# Patient Record
Sex: Female | Born: 1960 | Race: White | Hispanic: No | Marital: Married | Smoking: Never smoker
Health system: Southern US, Community
[De-identification: ages and names within clinical notes are randomized; demographics above are authoritative.]

## PROBLEM LIST (undated history)

## (undated) DIAGNOSIS — E785 Hyperlipidemia, unspecified: Secondary | ICD-10-CM

## (undated) DIAGNOSIS — E039 Hypothyroidism, unspecified: Secondary | ICD-10-CM

## (undated) HISTORY — PX: TUBAL LIGATION: SHX77

## (undated) HISTORY — PX: WISDOM TOOTH EXTRACTION: SHX21

## (undated) HISTORY — PX: COLONOSCOPY: SHX174

---

## 1997-08-25 ENCOUNTER — Inpatient Hospital Stay (HOSPITAL_COMMUNITY): Admission: AD | Admit: 1997-08-25 | Discharge: 1997-09-05 | Payer: Self-pay | Admitting: Obstetrics and Gynecology

## 1997-09-04 ENCOUNTER — Encounter (HOSPITAL_COMMUNITY): Admission: RE | Admit: 1997-09-04 | Discharge: 1997-12-03 | Payer: Self-pay | Admitting: Obstetrics and Gynecology

## 1998-05-28 ENCOUNTER — Ambulatory Visit (HOSPITAL_COMMUNITY): Admission: RE | Admit: 1998-05-28 | Discharge: 1998-05-28 | Payer: Self-pay | Admitting: Obstetrics and Gynecology

## 1998-08-11 ENCOUNTER — Inpatient Hospital Stay (HOSPITAL_COMMUNITY): Admission: AD | Admit: 1998-08-11 | Discharge: 1998-08-15 | Payer: Self-pay | Admitting: Obstetrics and Gynecology

## 1998-08-14 ENCOUNTER — Encounter: Payer: Self-pay | Admitting: Obstetrics and Gynecology

## 2001-05-31 ENCOUNTER — Other Ambulatory Visit: Admission: RE | Admit: 2001-05-31 | Discharge: 2001-05-31 | Payer: Self-pay | Admitting: Obstetrics and Gynecology

## 2004-09-30 ENCOUNTER — Other Ambulatory Visit: Admission: RE | Admit: 2004-09-30 | Discharge: 2004-09-30 | Payer: Self-pay | Admitting: *Deleted

## 2005-12-17 ENCOUNTER — Encounter: Admission: RE | Admit: 2005-12-17 | Discharge: 2005-12-17 | Payer: Self-pay | Admitting: Family Medicine

## 2005-12-18 ENCOUNTER — Other Ambulatory Visit: Admission: RE | Admit: 2005-12-18 | Discharge: 2005-12-18 | Payer: Self-pay | Admitting: *Deleted

## 2007-04-27 ENCOUNTER — Other Ambulatory Visit: Admission: RE | Admit: 2007-04-27 | Discharge: 2007-04-27 | Payer: Self-pay | Admitting: *Deleted

## 2007-05-25 ENCOUNTER — Encounter: Admission: RE | Admit: 2007-05-25 | Discharge: 2007-05-25 | Payer: Self-pay | Admitting: Family Medicine

## 2008-05-16 ENCOUNTER — Other Ambulatory Visit: Admission: RE | Admit: 2008-05-16 | Discharge: 2008-05-16 | Payer: Self-pay | Admitting: Family Medicine

## 2008-06-14 ENCOUNTER — Encounter: Admission: RE | Admit: 2008-06-14 | Discharge: 2008-06-14 | Payer: Self-pay | Admitting: Family Medicine

## 2008-06-20 ENCOUNTER — Ambulatory Visit: Payer: Self-pay | Admitting: Radiology

## 2008-06-20 ENCOUNTER — Emergency Department (HOSPITAL_BASED_OUTPATIENT_CLINIC_OR_DEPARTMENT_OTHER): Admission: EM | Admit: 2008-06-20 | Discharge: 2008-06-20 | Payer: Self-pay | Admitting: Emergency Medicine

## 2008-06-21 ENCOUNTER — Encounter: Admission: RE | Admit: 2008-06-21 | Discharge: 2008-06-21 | Payer: Self-pay | Admitting: Family Medicine

## 2011-03-11 ENCOUNTER — Other Ambulatory Visit: Payer: Self-pay | Admitting: Family Medicine

## 2011-03-11 ENCOUNTER — Other Ambulatory Visit (HOSPITAL_COMMUNITY)
Admission: RE | Admit: 2011-03-11 | Discharge: 2011-03-11 | Disposition: A | Discharge status: Home / Self Care | Payer: 59 | Source: Ambulatory Visit | Attending: Family Medicine | Admitting: Family Medicine

## 2011-03-11 DIAGNOSIS — Z1159 Encounter for screening for other viral diseases: Secondary | ICD-10-CM | POA: Insufficient documentation

## 2011-03-11 DIAGNOSIS — Z124 Encounter for screening for malignant neoplasm of cervix: Secondary | ICD-10-CM | POA: Insufficient documentation

## 2011-05-05 ENCOUNTER — Other Ambulatory Visit: Payer: Self-pay | Admitting: Family Medicine

## 2011-05-05 ENCOUNTER — Other Ambulatory Visit: Payer: Self-pay | Admitting: Internal Medicine

## 2011-05-05 DIAGNOSIS — Z1231 Encounter for screening mammogram for malignant neoplasm of breast: Secondary | ICD-10-CM

## 2011-06-04 ENCOUNTER — Ambulatory Visit
Admission: RE | Admit: 2011-06-04 | Discharge: 2011-06-04 | Disposition: A | Discharge status: Home / Self Care | Payer: 59 | Source: Ambulatory Visit | Attending: Family Medicine | Admitting: Family Medicine

## 2011-06-04 DIAGNOSIS — Z1231 Encounter for screening mammogram for malignant neoplasm of breast: Secondary | ICD-10-CM

## 2013-11-28 ENCOUNTER — Other Ambulatory Visit: Payer: Self-pay | Admitting: Family Medicine

## 2013-11-28 DIAGNOSIS — R748 Abnormal levels of other serum enzymes: Secondary | ICD-10-CM

## 2013-12-05 ENCOUNTER — Ambulatory Visit
Admission: RE | Admit: 2013-12-05 | Discharge: 2013-12-05 | Disposition: A | Discharge status: Home / Self Care | Payer: 59 | Source: Ambulatory Visit | Attending: Family Medicine | Admitting: Family Medicine

## 2013-12-05 DIAGNOSIS — R748 Abnormal levels of other serum enzymes: Secondary | ICD-10-CM

## 2013-12-27 ENCOUNTER — Other Ambulatory Visit: Payer: Self-pay | Admitting: Gastroenterology

## 2015-03-08 ENCOUNTER — Other Ambulatory Visit: Payer: Self-pay | Admitting: Physician Assistant

## 2015-03-08 ENCOUNTER — Other Ambulatory Visit: Payer: Self-pay

## 2015-03-08 ENCOUNTER — Other Ambulatory Visit (HOSPITAL_COMMUNITY)
Admission: RE | Admit: 2015-03-08 | Discharge: 2015-03-08 | Disposition: A | Discharge status: Home / Self Care | Payer: 59 | Source: Ambulatory Visit | Attending: Physician Assistant | Admitting: Physician Assistant

## 2015-03-08 DIAGNOSIS — Z01419 Encounter for gynecological examination (general) (routine) without abnormal findings: Secondary | ICD-10-CM | POA: Diagnosis present

## 2015-03-08 DIAGNOSIS — Z1151 Encounter for screening for human papillomavirus (HPV): Secondary | ICD-10-CM | POA: Diagnosis present

## 2015-03-08 DIAGNOSIS — Z1231 Encounter for screening mammogram for malignant neoplasm of breast: Secondary | ICD-10-CM

## 2015-03-11 ENCOUNTER — Other Ambulatory Visit: Payer: Self-pay | Admitting: Physician Assistant

## 2015-03-11 DIAGNOSIS — R221 Localized swelling, mass and lump, neck: Secondary | ICD-10-CM

## 2015-03-13 LAB — CYTOLOGY - PAP

## 2015-03-14 ENCOUNTER — Ambulatory Visit: Payer: Self-pay

## 2015-03-19 ENCOUNTER — Ambulatory Visit
Admission: RE | Admit: 2015-03-19 | Discharge: 2015-03-19 | Disposition: A | Discharge status: Home / Self Care | Payer: 59 | Source: Ambulatory Visit | Attending: Physician Assistant | Admitting: Physician Assistant

## 2015-03-19 ENCOUNTER — Ambulatory Visit
Admission: RE | Admit: 2015-03-19 | Discharge: 2015-03-19 | Disposition: A | Discharge status: Home / Self Care | Payer: 59 | Source: Ambulatory Visit

## 2015-03-19 DIAGNOSIS — Z1231 Encounter for screening mammogram for malignant neoplasm of breast: Secondary | ICD-10-CM

## 2015-03-19 DIAGNOSIS — R221 Localized swelling, mass and lump, neck: Secondary | ICD-10-CM

## 2016-03-13 ENCOUNTER — Other Ambulatory Visit (HOSPITAL_COMMUNITY)
Admission: RE | Admit: 2016-03-13 | Discharge: 2016-03-13 | Disposition: A | Discharge status: Home / Self Care | Payer: 59 | Source: Ambulatory Visit | Attending: Physician Assistant | Admitting: Physician Assistant

## 2016-03-13 ENCOUNTER — Other Ambulatory Visit: Payer: Self-pay | Admitting: Physician Assistant

## 2016-03-13 DIAGNOSIS — Z124 Encounter for screening for malignant neoplasm of cervix: Secondary | ICD-10-CM | POA: Diagnosis present

## 2016-03-13 DIAGNOSIS — Z1151 Encounter for screening for human papillomavirus (HPV): Secondary | ICD-10-CM | POA: Diagnosis not present

## 2016-03-16 LAB — CYTOLOGY - PAP

## 2016-04-20 ENCOUNTER — Encounter (HOSPITAL_COMMUNITY): Payer: Self-pay | Admitting: *Deleted

## 2016-04-29 ENCOUNTER — Ambulatory Visit (HOSPITAL_COMMUNITY)
Admission: RE | Admit: 2016-04-29 | Discharge: 2016-04-29 | Disposition: A | Discharge status: Home / Self Care | Payer: 59 | Source: Ambulatory Visit | Attending: Obstetrics and Gynecology | Admitting: Obstetrics and Gynecology

## 2016-04-29 ENCOUNTER — Encounter (HOSPITAL_COMMUNITY): Payer: Self-pay

## 2016-04-29 ENCOUNTER — Ambulatory Visit (HOSPITAL_COMMUNITY): Payer: 59 | Admitting: Anesthesiology

## 2016-04-29 ENCOUNTER — Encounter (HOSPITAL_COMMUNITY)
Admission: RE | Disposition: A | Discharge status: Home / Self Care | Payer: Self-pay | Source: Ambulatory Visit | Attending: Obstetrics and Gynecology

## 2016-04-29 ENCOUNTER — Other Ambulatory Visit: Payer: Self-pay | Admitting: Obstetrics and Gynecology

## 2016-04-29 DIAGNOSIS — N920 Excessive and frequent menstruation with regular cycle: Secondary | ICD-10-CM | POA: Insufficient documentation

## 2016-04-29 DIAGNOSIS — N840 Polyp of corpus uteri: Secondary | ICD-10-CM | POA: Diagnosis present

## 2016-04-29 DIAGNOSIS — N939 Abnormal uterine and vaginal bleeding, unspecified: Secondary | ICD-10-CM | POA: Diagnosis present

## 2016-04-29 HISTORY — DX: Hypothyroidism, unspecified: E03.9

## 2016-04-29 HISTORY — DX: Hyperlipidemia, unspecified: E78.5

## 2016-04-29 HISTORY — PX: DILATATION & CURETTAGE/HYSTEROSCOPY WITH MYOSURE: SHX6511

## 2016-04-29 LAB — CBC
HCT: 36.8 % (ref 36.0–46.0)
Hemoglobin: 12.7 g/dL (ref 12.0–15.0)
MCH: 32.2 pg (ref 26.0–34.0)
MCHC: 34.5 g/dL (ref 30.0–36.0)
MCV: 93.2 fL (ref 78.0–100.0)
PLATELETS: 414 10*3/uL — AB (ref 150–400)
RBC: 3.95 MIL/uL (ref 3.87–5.11)
RDW: 13.2 % (ref 11.5–15.5)
WBC: 8.8 10*3/uL (ref 4.0–10.5)

## 2016-04-29 SURGERY — DILATATION & CURETTAGE/HYSTEROSCOPY WITH MYOSURE
Anesthesia: Monitor Anesthesia Care | Site: Vagina

## 2016-04-29 MED ORDER — PROPOFOL 10 MG/ML IV BOLUS
INTRAVENOUS | Status: AC
  Filled 2016-04-29: qty 20

## 2016-04-29 MED ORDER — DEXAMETHASONE SODIUM PHOSPHATE 4 MG/ML IJ SOLN
INTRAMUSCULAR | Status: AC
  Filled 2016-04-29: qty 1

## 2016-04-29 MED ORDER — ONDANSETRON HCL 4 MG/2ML IJ SOLN
INTRAMUSCULAR | Status: DC | PRN
  Administered 2016-04-29: 4 mg via INTRAVENOUS

## 2016-04-29 MED ORDER — LIDOCAINE HCL (CARDIAC) 20 MG/ML IV SOLN
INTRAVENOUS | Status: DC | PRN
  Administered 2016-04-29: 50 mg via INTRAVENOUS

## 2016-04-29 MED ORDER — PROPOFOL 10 MG/ML IV BOLUS
INTRAVENOUS | Status: DC | PRN
  Administered 2016-04-29: 200 mg via INTRAVENOUS

## 2016-04-29 MED ORDER — OXYCODONE-ACETAMINOPHEN 5-325 MG PO TABS: 1.0000 | ORAL_TABLET | ORAL | 0 refills | Status: AC | PRN

## 2016-04-29 MED ORDER — DEXAMETHASONE SODIUM PHOSPHATE 10 MG/ML IJ SOLN
INTRAMUSCULAR | Status: DC | PRN
  Administered 2016-04-29: 4 mg via INTRAVENOUS

## 2016-04-29 MED ORDER — KETOROLAC TROMETHAMINE 30 MG/ML IJ SOLN
INTRAMUSCULAR | Status: AC
  Filled 2016-04-29: qty 1

## 2016-04-29 MED ORDER — LACTATED RINGERS IV SOLN
INTRAVENOUS | Status: DC
  Administered 2016-04-29: 125 mL/h via INTRAVENOUS

## 2016-04-29 MED ORDER — GLYCOPYRROLATE 0.2 MG/ML IJ SOLN
INTRAMUSCULAR | Status: DC | PRN
  Administered 2016-04-29: 0.2 mg via INTRAVENOUS

## 2016-04-29 MED ORDER — KETOROLAC TROMETHAMINE 30 MG/ML IJ SOLN
INTRAMUSCULAR | Status: DC | PRN
  Administered 2016-04-29: 30 mg via INTRAVENOUS

## 2016-04-29 MED ORDER — SCOPOLAMINE 1 MG/3DAYS TD PT72
1.0000 | MEDICATED_PATCH | Freq: Once | TRANSDERMAL | Status: DC
  Administered 2016-04-29: 1.5 mg via TRANSDERMAL

## 2016-04-29 MED ORDER — MIDAZOLAM HCL 2 MG/2ML IJ SOLN
INTRAMUSCULAR | Status: AC
  Filled 2016-04-29: qty 2

## 2016-04-29 MED ORDER — LIDOCAINE HCL (CARDIAC) 20 MG/ML IV SOLN: INTRAVENOUS | Status: DC | PRN

## 2016-04-29 MED ORDER — BUPIVACAINE HCL (PF) 0.25 % IJ SOLN
INTRAMUSCULAR | Status: DC | PRN
  Administered 2016-04-29: 20 mL

## 2016-04-29 MED ORDER — SODIUM CHLORIDE 0.9 % IR SOLN
Status: DC | PRN
  Administered 2016-04-29: 3000 mL

## 2016-04-29 MED ORDER — MIDAZOLAM HCL 2 MG/2ML IJ SOLN
INTRAMUSCULAR | Status: DC | PRN
  Administered 2016-04-29: 2 mg via INTRAVENOUS

## 2016-04-29 MED ORDER — LIDOCAINE HCL (CARDIAC) 20 MG/ML IV SOLN
INTRAVENOUS | Status: AC
  Filled 2016-04-29: qty 5

## 2016-04-29 MED ORDER — BUPIVACAINE HCL (PF) 0.25 % IJ SOLN
INTRAMUSCULAR | Status: AC
  Filled 2016-04-29: qty 30

## 2016-04-29 MED ORDER — GLYCOPYRROLATE 0.2 MG/ML IJ SOLN
INTRAMUSCULAR | Status: AC
  Filled 2016-04-29: qty 1

## 2016-04-29 MED ORDER — SCOPOLAMINE 1 MG/3DAYS TD PT72
MEDICATED_PATCH | TRANSDERMAL | Status: AC
  Administered 2016-04-29: 1.5 mg via TRANSDERMAL
  Filled 2016-04-29: qty 1

## 2016-04-29 MED ORDER — IBUPROFEN 800 MG PO TABS: ORAL_TABLET | ORAL | 0 refills | Status: AC

## 2016-04-29 MED ORDER — ONDANSETRON HCL 4 MG/2ML IJ SOLN
INTRAMUSCULAR | Status: AC
Start: 2016-04-29 — End: 2016-04-29
  Filled 2016-04-29: qty 2

## 2016-04-29 MED ORDER — FENTANYL CITRATE (PF) 250 MCG/5ML IJ SOLN
INTRAMUSCULAR | Status: AC
  Filled 2016-04-29: qty 5

## 2016-04-29 MED ORDER — FENTANYL CITRATE (PF) 100 MCG/2ML IJ SOLN
INTRAMUSCULAR | Status: DC | PRN
  Administered 2016-04-29 (×3): 50 ug via INTRAVENOUS

## 2016-04-29 SURGICAL SUPPLY — 22 items
CANISTER SUCT 3000ML (MISCELLANEOUS) ×2 IMPLANT
CATH ROBINSON RED A/P 16FR (CATHETERS) ×2 IMPLANT
CLOTH BEACON ORANGE TIMEOUT ST (SAFETY) ×2 IMPLANT
CONTAINER PREFILL 10% NBF 60ML (FORM) ×4 IMPLANT
DEVICE MYOSURE LITE (MISCELLANEOUS) ×1 IMPLANT
DEVICE MYOSURE REACH (MISCELLANEOUS) IMPLANT
ELECT REM PT RETURN 9FT ADLT (ELECTROSURGICAL) ×2
ELECTRODE REM PT RTRN 9FT ADLT (ELECTROSURGICAL) ×1 IMPLANT
FILTER ARTHROSCOPY CONVERTOR (FILTER) ×2 IMPLANT
GLOVE BIOGEL M 6.5 STRL (GLOVE) ×4 IMPLANT
GLOVE BIOGEL PI IND STRL 6.5 (GLOVE) ×1 IMPLANT
GLOVE BIOGEL PI IND STRL 7.0 (GLOVE) ×1 IMPLANT
GLOVE BIOGEL PI INDICATOR 6.5 (GLOVE) ×1
GLOVE BIOGEL PI INDICATOR 7.0 (GLOVE) ×1
GOWN STRL REUS W/TWL LRG LVL3 (GOWN DISPOSABLE) ×4 IMPLANT
PACK VAGINAL MINOR WOMEN LF (CUSTOM PROCEDURE TRAY) ×2 IMPLANT
PAD OB MATERNITY 4.3X12.25 (PERSONAL CARE ITEMS) ×2 IMPLANT
SEAL ROD LENS SCOPE MYOSURE (ABLATOR) ×2 IMPLANT
TOWEL OR 17X24 6PK STRL BLUE (TOWEL DISPOSABLE) ×4 IMPLANT
TUBING AQUILEX INFLOW (TUBING) ×2 IMPLANT
TUBING AQUILEX OUTFLOW (TUBING) ×2 IMPLANT
WATER STERILE IRR 1000ML POUR (IV SOLUTION) ×2 IMPLANT

## 2016-04-29 NOTE — H&P (Signed)
Chief Complaint(s):   preop  History and Physical. Abnormal Uterine Bleeding   HPI:  General 55 y/o presents presents for preop history and physical exam in preparation for hysteroscopy D&C and myosure polypectomy scheduled for 04/29/2016. she has menorrhagia with irregular cycle and dysmenorrhea. she started bleeding 02/12/2016 and has bleed daily. she changed super plus tampon and pad every hour. she reports most days were heavy. 3 days ago the heavy bleeding decreased. she started having sharp pain on ther right side 3 days ago. she rates the sharp pain as 8 out 10. she also has cramping that she rates as 5 out 10.  she has not required medication for pain. she has never had the pain or heavy menses before. she does have hypothyroidism. she denies significant weight loss or gains the last 6 months.  she was treated with provera 20 mg with decrease in bleeding. she changes a pad every 2 hours. she ran out of the provera 2 days ago.  Her bleeding decreased but has not stopped completely.  Her ultrasound 04/08/2016 shows at 9 cm x 6 cm x 6 cm uterus. The endometrium is thickened 2.2 cm with complex fluid in the lower uterine segment / cervix. her ovaries are normal bilaterally.  Current Medication:  Taking  Vitamin D 1000 UNIT Tablet 1 tablet Orally Once a day     Aspirin 81 MG Tablet 1 tablet Orally Twice a day     Flexeril(Cyclobenzaprine HCl) 10 MG Tablet 1/2-1 tablet Orally Three times a day as needed for pain     Valtrex(ValACYclovir HCl) 500 MG Tablet 1 tablet Orally once a day, Notes: MAUNEY     Phenergan 25 mg tabs 25 mg Tablets 1-2 tablets Orally q 6 hrs, prn nausea     Levothyroxine Sodium 112 MCG Tablet 1 tablet by mouth once a day, Notes: MAUNEY     Atorvastatin Calcium 10 MG Tablet 1 tablet Orally Once a day   Discontinued  Levothyroxine Sodium 0.112MG  Tablet 1 tablet by mouth once a day     Medication List reviewed and reconciled with the patient   Medical History:   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   Gyn History:   Sexual activity currently sexually active with 1 female partner for 8 months, separated from husband. LMP 02/12/16-current 04/21/16. Last pap smear date 03/13/16. Last mammogram date 03/19/15. Abnormal pap smear yes. H/O STD genital herpes. GYN procedures bil tubal ligation. Other: History of cervical cancer w/conization and cryosurgery 1997.   OB History:   OB History 1 Vaginal, 2 x C-Section, 1 x placenta previa. Number of pregnancies 5. miscarriages 2. Pregnancy # 1 live birth, vaginal delivery, girl. Pregnancy # 2 miscarriage. Pregnancy # 3 miscarriage. Pregnancy # 4: live birth, C-section, boy . Pregnancy # 5: Live Birth, C-section, Boy.   Surgical History:   C section x 2     tubal ligation during cesareansection   Hospitalization:   childbirth x3     none 02/2016   Family History:  Adopted - Unknown family history.  Social History:  General Tobacco use cigarettes: Never smoked, Tobacco history last updated 04/21/2016.  no  EXPOSURE TO PASSIVE SMOKE.  Alcohol: yes, 1 per week.  no Caffeine, decaf usually.  no Recreational drug use.  no DIET.  Exercise: yes, walks.  Marital Status: married.  Children: girls, 1, Boys, 2.  EDUCATION: Some College.  OCCUPATION: employed, Musician.  COMMUNICATION BARRIERS: none.  no Tobacco Exposure.  ROS: CONSTITUTIONAL none" options="no,yes" propid="91" itemid="[id number]" categoryid="10464" encounterid="[id number]"Fatigue none. none today" options="no,yes" propid="91" itemid="10467" categoryid="10464" encounterid="[id number]"Fever none today.  CARDIOLOGY none" options="no,yes" propid="91" itemid="[id number]" categoryid="10488"  encounterid="[id number]"Chest pain none.  RESPIRATORY no" options="no" propid="91" itemid="270013" categoryid="138132" encounterid="[id number]"Shortness of breath no. no" options="no,yes" propid="91" itemid="172745" categoryid="138132" encounterid="[id number]"Cough no.  GASTROENTEROLOGY none" options="no,yes" propid="91" itemid="193447" categoryid="10494" encounterid="[id number]"Appetite change none. no" options="no,yes" propid="91" itemid="193449" categoryid="10494" encounterid="[id number]"Change in bowel habits no.  FEMALE REPRODUCTIVE no" options="no,yes" propid="91" itemid="196298" categoryid="10525" encounterid="[id number]"Breast lumps or discharge no. none" options="no,yes" propid="91" itemid="186083" categoryid="10525" encounterid="[id number]"Breast pain none. none" options="no,yes" propid="91" itemid="138198" categoryid="10525" encounterid="[id number]"Dyspareunia none. no" options="no,yes" propid="91" itemid="202654" categoryid="10525" encounterid="[id number]"Dysuria no. none" options="no,yes" propid="91" itemid="186082" categoryid="10525" encounterid="[id number]"Pelvic pain none. NO" options="no,yes" propid="91" itemid="199173" categoryid="10525" encounterid="[id number]"Regular menses NO. no" options="no,yes" propid="91" itemid="278230" categoryid="10525" encounterid="[id number]"Unusual vaginal discharge no. no" options="no,yes" propid="91" itemid="278942" categoryid="10525" encounterid="[id number]"Vaginal itching no. no" options="no,yes" propid="91" itemid="278837" categoryid="10525" encounterid="[id number]"Vulvar/labial lesion no.  NEUROLOGY none" options="no,yes" propid="91" itemid="193627" categoryid="12512" encounterid="[id number]"Migraines none. none" options="no,yes" propid="91" itemid="12514" categoryid="12512" encounterid="[id number]"Tingling/numbness none. none" options="no,yes" propid="91" itemid="193467" categoryid="12512" encounterid="[id number]"Visual changes none.  PSYCHOLOGY no" options="" propid="91" itemid="275919"  categoryid="10520" encounterid="[id number]"Depression no.  SKIN no" options="no,yes" propid="91" itemid="269383" categoryid="202750" encounterid="[id number]"Rash no. no" options="no,yes" propid="91" itemid="202757" categoryid="202750" encounterid="[id number]"Suspicious lesions no.  ENDOCRINOLOGY none" options="no,yes" propid="91" itemid="202624" categoryid="12508" encounterid="[id number]"Hot flashes none. no unintentional" options="no,yes" propid="91" itemid="193436" categoryid="12508" encounterid="[id number]"Weight gain no unintentional. none" options="no,yes" propid="91" itemid="138164" categoryid="12508" encounterid="[id number]"Weight loss none.  HEMATOLOGY/LYMPH no" options="no,yes" propid="91" itemid="193454" categoryid="138157" encounterid="[id number]"Anemia no.    Objective: Vitals:  Wt 200, Wt change 2 lb, Pulse sitting 76, BP sitting 111/68  Past Results:  Examination:  General Examination McCoy,Tiffany 04/21/2016 04:00:52 PM &gt; , for female genital exam" categoryPropId="[id number]" examid="[id number]"CHAPERONE PRESENT McCoy,Tiffany 04/21/2016 04:00:52 PM > , for female genital exam.  Physical Examination: GENERAL in NAD, pleasant"Patient appears in NAD, pleasant. well developed"Build: well developed. overweight"General Appearance: overweight. caucasian"Race: caucasian.  LUNGS clear to auscultation"Breath sounds: clear to auscultation. no"Dyspnea: no.  HEART none"Murmurs: none. normal"Rate: normal. regular"Rhythm: regular.  ABDOMEN no masses,tenderness,organomegaly, no CVAT"General: no masses,tenderness,organomegaly, no CVAT.  FEMALE GENITOURINARY no mass, non tender"Adnexa: no mass, non tender. normal, no lesions"Anus/perineum: normal, no lesions. normal appearance , no lesions/discharge/bleeding,, good pelvic support , external os normal "Cervix/ cuff: normal appearance , no lesions/discharge/bleeding,, good pelvic support , external os normal . normal, no lesions, no skin discoloration, no  lymphadenopathy"External genitalia: normal, no lesions, no skin discoloration, no lymphadenopathy. deferred"Rectum: deferred. normal external meatus"Urethra: normal external meatus. normal size/shape/consistency, freely mobile, non tender"Uterus: normal size/shape/consistency, freely mobile, non tender. pink/moist mucosa, no lesions, no abnormal discharge, odorless"Vagina: pink/moist mucosa, no lesions, no abnormal discharge, odorless. normal, no lesions, no skin discoloration, non tender"Vulva: normal, no lesions, no skin discoloration, non tender.  EXTREMITIES FROM of all extremities"Extremities FROM of all extremities.  NEUROLOGICAL normal"Gait: normal. alert and oriented x 3"Orientation: alert and oriented x 3.    Assessment: Assessment:  Abnormal uterine bleeding - N93.9 (Primary)     Endometrial polyp - N84.0     Plan: Treatment:  Abnormal uterine bleeding  Start Provera(Medroxyprogesterone) Tablet, 10 MG, 2 tablets with food, Orally, Once a day, 10 days, 20 Tablet, Refills 0  Notes: options of management reviewed. pt desires to proceed with hysteroscopy D&C and polypectomy with myosure. r/b/a of surgery were discussed with the patient including but  not limited to infection bleeding , uterine perforation with the need for further surgery. pt voiced understanding and desires to proceed.  Endometrial polyp  Notes: pt desires to proceed with hysteroscopy D&C and polypectomy with myosure. r/b/a of surgery were discussed with the patient including but not limited to infection bleeding , uterine perforation with the need for further surgery. pt voiced understanding and desires to proceed.

## 2016-04-29 NOTE — Anesthesia Postprocedure Evaluation (Signed)
Anesthesia Post Note  Patient: Kari CohoMichelle S Jones  Procedure(s) Performed: Procedure(s) (LRB): DILATATION & CURETTAGE/HYSTEROSCOPY WITH MYOSURE (N/A)  Patient location during evaluation: PACU Anesthesia Type: General Level of consciousness: awake and alert Pain management: pain level controlled Vital Signs Assessment: post-procedure vital signs reviewed and stable Respiratory status: spontaneous breathing, nonlabored ventilation, respiratory function stable and patient connected to nasal cannula oxygen Cardiovascular status: blood pressure returned to baseline and stable Postop Assessment: no signs of nausea or vomiting Anesthetic complications: no     Last Vitals:  Vitals:   04/29/16 1703 04/29/16 1715  BP: 130/80 127/72  Pulse: (!) 118 (!) 109  Resp: 16 11  Temp: 36.7 C     Last Pain:  Vitals:   04/29/16 1508  TempSrc: Oral   Pain Goal: Patients Stated Pain Goal: 5 (04/29/16 1508)               Phillips Groutarignan, Kyng Matlock

## 2016-04-29 NOTE — Anesthesia Preprocedure Evaluation (Signed)
Anesthesia Evaluation  Patient identified by MRN, date of birth, ID band Patient awake    Reviewed: Allergy & Precautions, NPO status , Patient's Chart, lab work & pertinent test results  Airway Mallampati: II  TM Distance: >3 FB Neck ROM: Full    Dental no notable dental hx.    Pulmonary neg pulmonary ROS,    Pulmonary exam normal breath sounds clear to auscultation       Cardiovascular negative cardio ROS Normal cardiovascular exam Rhythm:Regular Rate:Normal     Neuro/Psych negative neurological ROS  negative psych ROS   GI/Hepatic negative GI ROS, Neg liver ROS,   Endo/Other  Hypothyroidism   Renal/GU negative Renal ROS  negative genitourinary   Musculoskeletal negative musculoskeletal ROS (+)   Abdominal   Peds negative pediatric ROS (+)  Hematology negative hematology ROS (+)   Anesthesia Other Findings   Reproductive/Obstetrics negative OB ROS                             Anesthesia Physical Anesthesia Plan  ASA: II  Anesthesia Plan: General and MAC   Post-op Pain Management:    Induction: Intravenous  Airway Management Planned: LMA and Natural Airway  Additional Equipment:   Intra-op Plan:   Post-operative Plan: Extubation in OR  Informed Consent: I have reviewed the patients History and Physical, chart, labs and discussed the procedure including the risks, benefits and alternatives for the proposed anesthesia with the patient or authorized representative who has indicated his/her understanding and acceptance.   Dental advisory given  Plan Discussed with: CRNA  Anesthesia Plan Comments:         Anesthesia Quick Evaluation

## 2016-04-29 NOTE — Transfer of Care (Signed)
Immediate Anesthesia Transfer of Care Note  Patient: Kari CohoMichelle S Ulatowski  Procedure(s) Performed: Procedure(s): DILATATION & CURETTAGE/HYSTEROSCOPY WITH MYOSURE (N/A)  Patient Location: PACU  Anesthesia Type:General  Level of Consciousness: awake, alert  and oriented  Airway & Oxygen Therapy: Patient Spontanous Breathing and Patient connected to nasal cannula oxygen  Post-op Assessment: Report given to RN and Post -op Vital signs reviewed and stable  Post vital signs: Reviewed and stable  Last Vitals:  Vitals:   04/29/16 1508  BP: 111/77  Pulse: 84  Resp: 16  Temp: 36.9 C    Last Pain:  Vitals:   04/29/16 1508  TempSrc: Oral      Patients Stated Pain Goal: 5 (04/29/16 1508)  Complications: No apparent anesthesia complications

## 2016-04-29 NOTE — Discharge Instructions (Signed)

## 2016-04-29 NOTE — Op Note (Signed)
04/29/2016  5:14 PM  PATIENT:  Kari CohoMichelle S Jones  55 y.o. female  PRE-OPERATIVE DIAGNOSIS:  MENORRHAGIA WITH REGULAR CYCLE  POST-OPERATIVE DIAGNOSIS:  menorrhagia with regular cycle  PROCEDURE:  Procedure(s): DILATATION & CURETTAGE/HYSTEROSCOPY WITH MYOSURE (N/A)  SURGEON:  Surgeon(s) and Role:    * Gerald Leitzara Reyana Leisey, MD - Primary  PHYSICIAN ASSISTANT:   ASSISTANTS: none   ANESTHESIA:   general  EBL:  Total I/O In: 700 [I.V.:700] Out: 40 [Urine:15; Blood:25]  BLOOD ADMINISTERED:none  DRAINS: none   LOCAL MEDICATIONS USED:  MARCAINE     SPECIMEN:  Source of Specimen:  polypoid endometrial currettings   DISPOSITION OF SPECIMEN:  PATHOLOGY  COUNTS:  YES  TOURNIQUET:  * No tourniquets in log *  DICTATION: .Dragon Dictation  PLAN OF CARE: Discharge to home after PACU  PATIENT DISPOSITION:  PACU - hemodynamically stable.   Delay start of Pharmacological VTE agent (>24 hrs) due to surgical blood loss or risk of bleeding: not applicable  Findings: polypoid and proliferative appearing endometrium .   Procedure: Patient was taken to the operating room where she was placed under general anesthesia. Time out was performed.  She was placed in the dorsal lithotomy position. She was prepped and draped in the usual sterile fashion. A speculum was placed into the vaginal vault. The anterior lip of the cervix was grasped with a single-tooth tenaculum. Quarter percent Marcaine was injected at the 4 and 8:00 positions of the cervix. The uterus  was then sounded to 8 cm. The cervix was dilated to approximately 6 mm. The Myosure  operative  hysteroscope was inserted. The findings noted above. The Myosure small polyp blade was introduced through the hysteroscope. The polypoid endometrial tissue was removed without difficulty .  There was no evidence of perforation. Hysteroscope was then removed. A sharp curette was inserted and tissue was removed until a gritty texture was noted. The hysteroscope  was reinserted . No perforations were noted.  The single-tooth tenaculum was removed from the anterior lip of the cervix. Patient was noted to have bleeding from the tenaculum site. Silver nitrate was applied and excellent hemostasis was noted. The speculum was removed from the patient's vagina. She was awakened from anesthesia taken care  To the recovery  room awake and in stable condition. Sponge lap and needle counts were correct x2.

## 2016-04-29 NOTE — Anesthesia Procedure Notes (Signed)
Procedure Name: LMA Insertion Date/Time: 04/29/2016 4:16 PM Performed by: Shanon PayorGREGORY, Darel Ricketts M Pre-anesthesia Checklist: Patient identified, Emergency Drugs available, Suction available, Patient being monitored and Timeout performed Patient Re-evaluated:Patient Re-evaluated prior to inductionOxygen Delivery Method: Circle system utilized Preoxygenation: Pre-oxygenation with 100% oxygen Intubation Type: IV induction LMA: LMA inserted LMA Size: 4.0 Number of attempts: 1 Placement Confirmation: breath sounds checked- equal and bilateral and positive ETCO2 Dental Injury: Teeth and Oropharynx as per pre-operative assessment

## 2016-04-30 ENCOUNTER — Encounter (HOSPITAL_COMMUNITY): Payer: Self-pay | Admitting: Obstetrics and Gynecology

## 2017-05-13 ENCOUNTER — Other Ambulatory Visit (HOSPITAL_COMMUNITY)
Admission: RE | Admit: 2017-05-13 | Discharge: 2017-05-13 | Disposition: A | Discharge status: Home / Self Care | Payer: BLUE CROSS/BLUE SHIELD | Source: Ambulatory Visit | Attending: Obstetrics and Gynecology | Admitting: Obstetrics and Gynecology

## 2017-05-13 ENCOUNTER — Other Ambulatory Visit: Payer: Self-pay | Admitting: Obstetrics and Gynecology

## 2017-05-13 DIAGNOSIS — Z01419 Encounter for gynecological examination (general) (routine) without abnormal findings: Secondary | ICD-10-CM | POA: Diagnosis present

## 2017-05-14 LAB — CYTOLOGY - PAP
Adequacy: ABSENT
Diagnosis: NEGATIVE

## 2017-05-28 ENCOUNTER — Other Ambulatory Visit: Payer: Self-pay | Admitting: Obstetrics and Gynecology

## 2017-05-28 DIAGNOSIS — Z1231 Encounter for screening mammogram for malignant neoplasm of breast: Secondary | ICD-10-CM

## 2017-06-29 ENCOUNTER — Ambulatory Visit
Admission: RE | Admit: 2017-06-29 | Discharge: 2017-06-29 | Disposition: A | Discharge status: Home / Self Care | Payer: BLUE CROSS/BLUE SHIELD | Source: Ambulatory Visit | Attending: Obstetrics and Gynecology | Admitting: Obstetrics and Gynecology

## 2017-06-29 DIAGNOSIS — Z1231 Encounter for screening mammogram for malignant neoplasm of breast: Secondary | ICD-10-CM

## 2019-09-29 IMAGING — MG DIGITAL SCREENING BILATERAL MAMMOGRAM WITH CAD
4 series · 4 of 4 positions shown · non-contrast
Comparison: Previous exam(s).

CLINICAL DATA: Screening.

EXAM:
DIGITAL SCREENING BILATERAL MAMMOGRAM WITH CAD

[R CC]
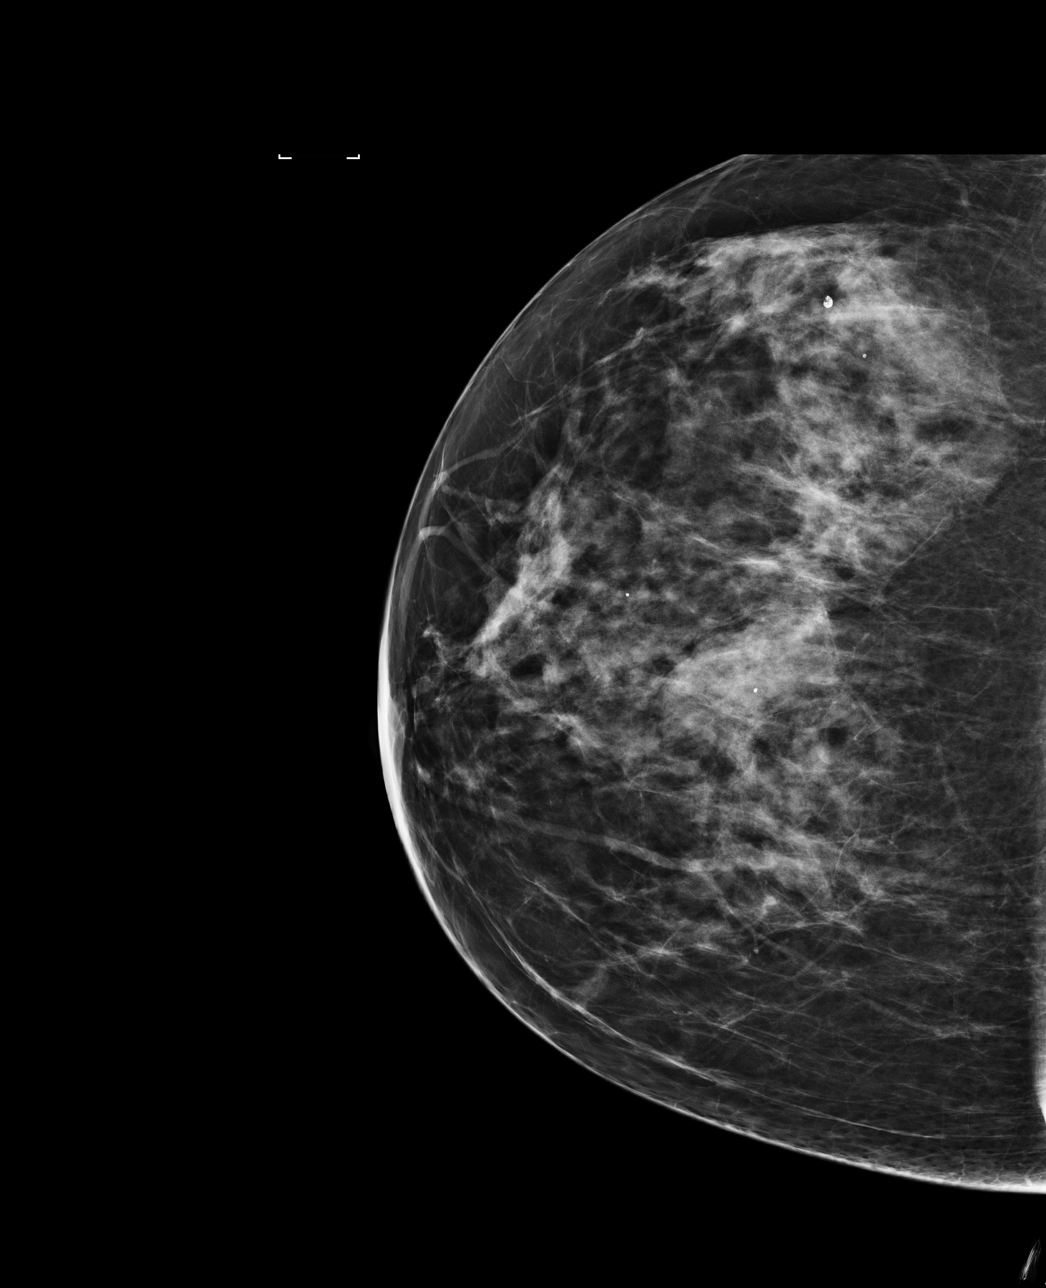

[L CC]
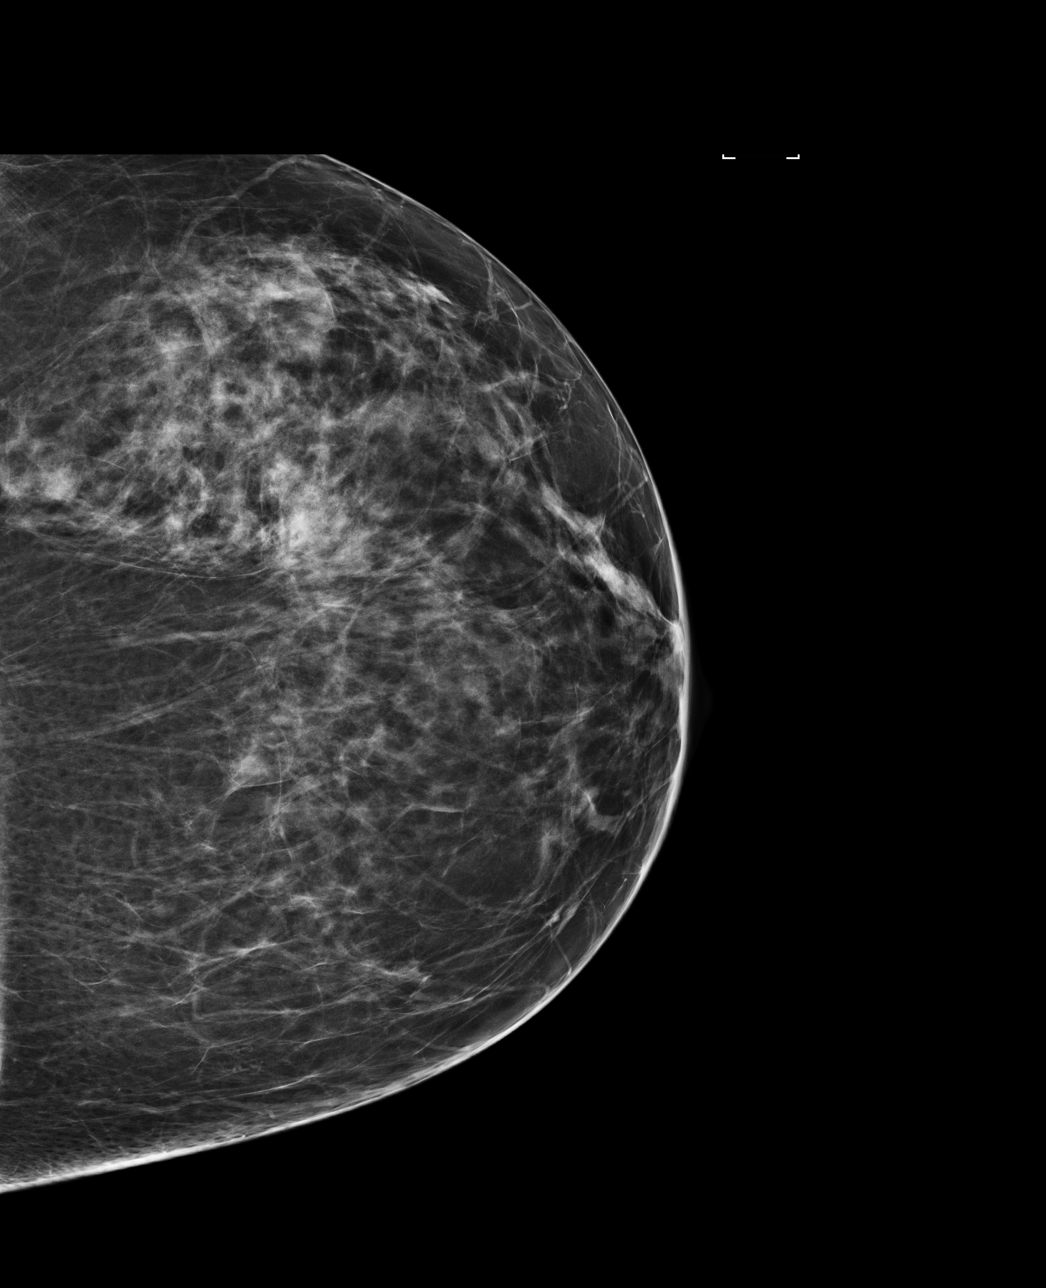

[L MLO]
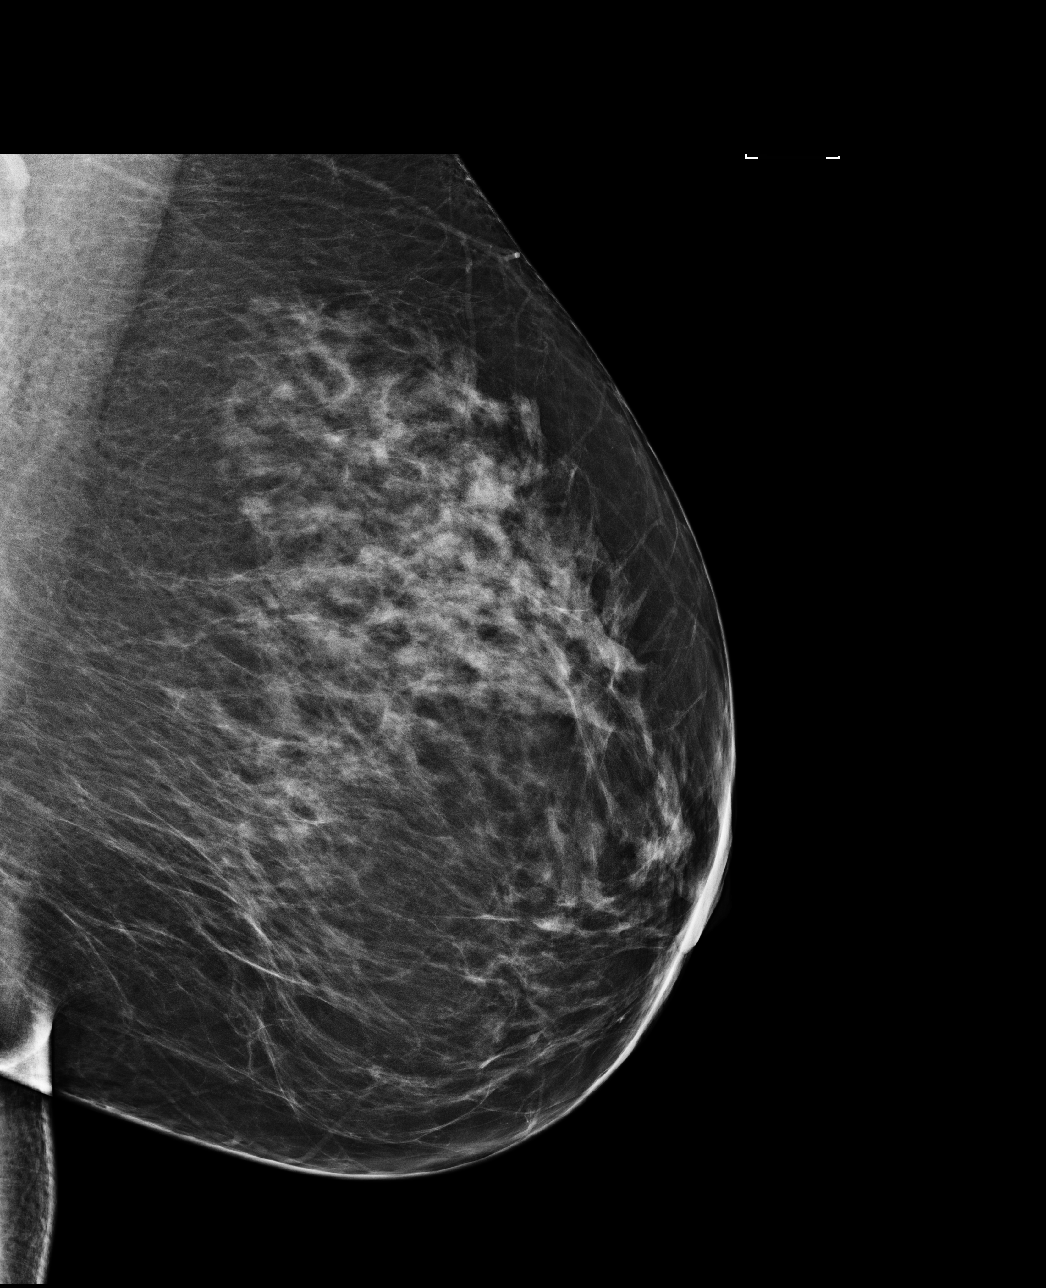

[R MLO]
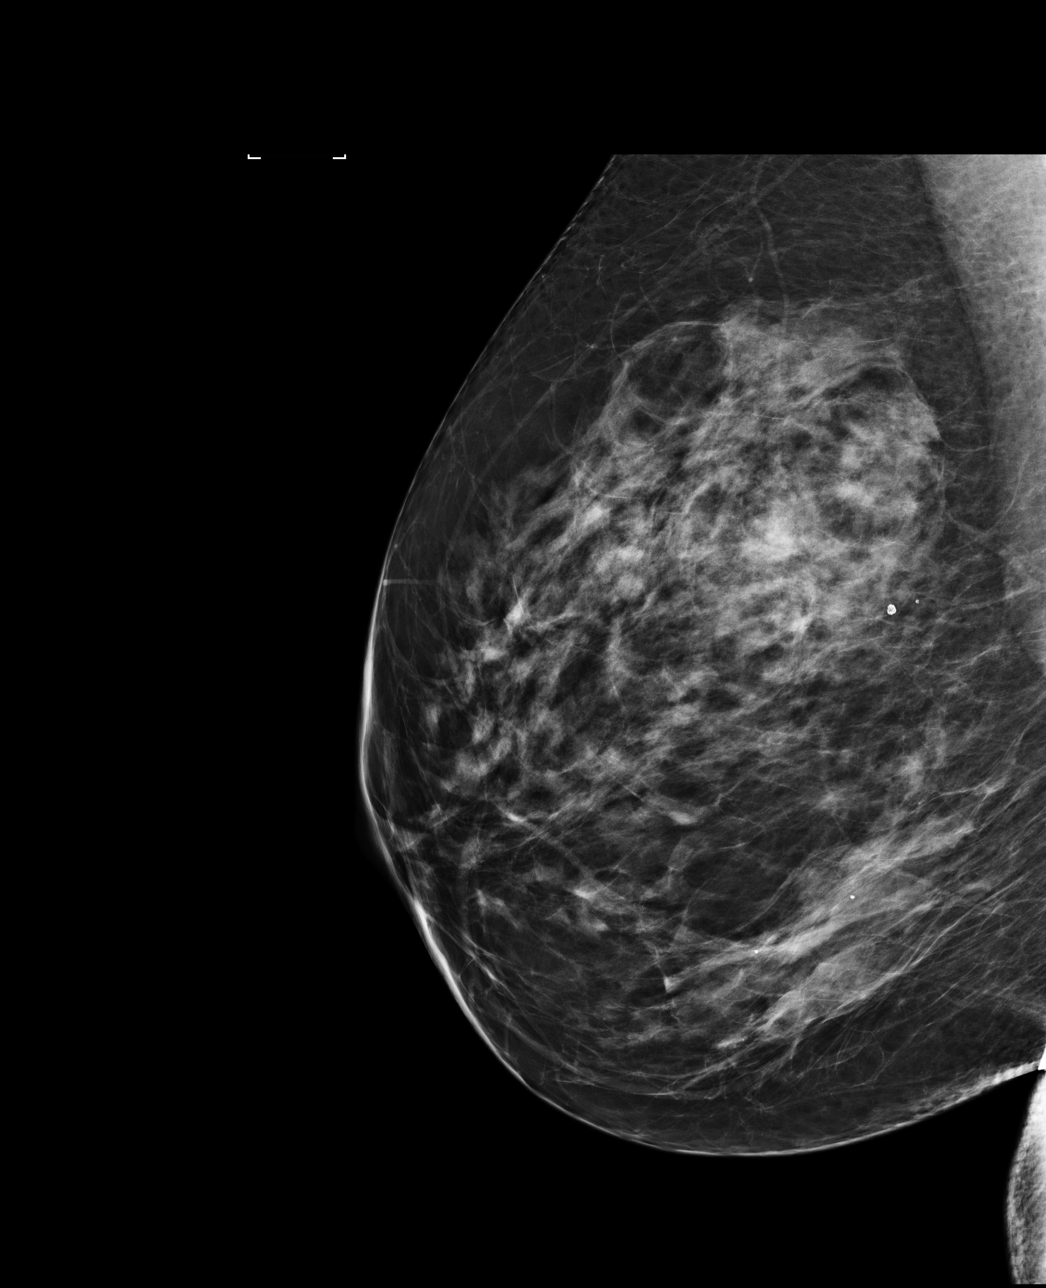

[4 of 4 positions shown; findings below may reference images not displayed]

ACR Breast Density Category c: The breast tissue is heterogeneously
dense, which may obscure small masses.
FINDINGS: There are no findings suspicious for malignancy. Images were
processed with CAD.
IMPRESSION: No mammographic evidence of malignancy. A result letter of this
screening mammogram will be mailed directly to the patient.

RECOMMENDATION:
Screening mammogram in one year. (Code:YJ-[id number])

BI-RADS CATEGORY  1: Negative.
